# Patient Record
Sex: Female | Born: 1965 | Race: Black or African American | Hispanic: No | Marital: Single | Smoking: Current every day smoker
Health system: Southern US, Community
[De-identification: ages and names within clinical notes are randomized; demographics above are authoritative.]

## PROBLEM LIST (undated history)

## (undated) DIAGNOSIS — K219 Gastro-esophageal reflux disease without esophagitis: Secondary | ICD-10-CM

## (undated) HISTORY — PX: HERNIA REPAIR: SHX51

---

## 2005-07-01 ENCOUNTER — Emergency Department: Payer: Self-pay | Admitting: Unknown Physician Specialty

## 2005-08-20 ENCOUNTER — Emergency Department: Payer: Self-pay | Admitting: General Practice

## 2014-12-29 ENCOUNTER — Ambulatory Visit
Admit: 2014-12-29 | Disposition: A | Discharge status: Home / Self Care | Payer: Self-pay | Attending: Family Medicine | Admitting: Family Medicine

## 2018-03-24 ENCOUNTER — Other Ambulatory Visit: Payer: Self-pay

## 2018-03-24 ENCOUNTER — Encounter: Payer: Self-pay | Admitting: Emergency Medicine

## 2018-03-24 ENCOUNTER — Emergency Department: Payer: BLUE CROSS/BLUE SHIELD

## 2018-03-24 ENCOUNTER — Emergency Department
Admission: EM | Admit: 2018-03-24 | Discharge: 2018-03-24 | Disposition: A | Discharge status: Home / Self Care | Payer: BLUE CROSS/BLUE SHIELD | Attending: Emergency Medicine | Admitting: Emergency Medicine

## 2018-03-24 DIAGNOSIS — T18108A Unspecified foreign body in esophagus causing other injury, initial encounter: Secondary | ICD-10-CM | POA: Diagnosis present

## 2018-03-24 DIAGNOSIS — Y999 Unspecified external cause status: Secondary | ICD-10-CM | POA: Insufficient documentation

## 2018-03-24 DIAGNOSIS — Y9389 Activity, other specified: Secondary | ICD-10-CM | POA: Diagnosis not present

## 2018-03-24 DIAGNOSIS — Y929 Unspecified place or not applicable: Secondary | ICD-10-CM | POA: Diagnosis not present

## 2018-03-24 DIAGNOSIS — W228XXA Striking against or struck by other objects, initial encounter: Secondary | ICD-10-CM | POA: Diagnosis not present

## 2018-03-24 DIAGNOSIS — F17200 Nicotine dependence, unspecified, uncomplicated: Secondary | ICD-10-CM | POA: Insufficient documentation

## 2018-03-24 HISTORY — DX: Gastro-esophageal reflux disease without esophagitis: K21.9

## 2018-03-24 MED ORDER — GI COCKTAIL ~~LOC~~
30.0000 mL | Freq: Once | ORAL | Status: AC
  Administered 2018-03-24: 30 mL via ORAL
  Filled 2018-03-24: qty 30

## 2018-03-24 NOTE — ED Provider Notes (Signed)
CT negative. Pt informed. F/u ENT if sx persist.    Sharman CheekStafford, Jahlia Omura, MD 03/24/18 [phone]

## 2018-03-24 NOTE — Discharge Instructions (Addendum)
Your CT scan of the neck did not show any abnormalities. Please follow up with ENT for further evaluation of your symptoms if they do not resolve in the next 24 hours.

## 2018-03-24 NOTE — ED Provider Notes (Signed)
[facility] Emergency Department Provider Note _____  Time seen: 3:45 AM  I have reviewed the triage vital signs and the nursing notes.   HISTORY  Chief Complaint Foreign Body    HPI Megan Hurley is a 52 y.o. female presents to the emergency department with feeling of a fishbone stuck in her throat after eating fish today.  Patient states pain with swallowing.  Patient able to swallow saliva however states that that is quite painful.  She admits to difficulty eating secondary to discomfort   Past Medical History:  Diagnosis Date  . Acid reflux     There are no active problems to display for this patient.   Past Surgical History:  Procedure Laterality Date  . HERNIA REPAIR      Prior to Admission medications   Not on File    Allergies Clindamycin/lincomycin  No family history on file.  Social History Social History   Tobacco Use  . Smoking status: Current Every Day Smoker  . Smokeless tobacco: Never Used  Substance Use Topics  . Alcohol use: Not on file  . Drug use: Not on file    Review of Systems Constitutional: No fever/chills Eyes: No visual changes. ENT: No sore throat.  Positive for foreign body sensation in the throat. Cardiovascular: Denies chest pain. Respiratory: Denies shortness of breath. Gastrointestinal: No abdominal pain.  No nausea, no vomiting.  No diarrhea.  No constipation. Genitourinary: Negative for dysuria. Musculoskeletal: Negative for neck pain.  Negative for back pain. Integumentary: Negative for rash. Neurological: Negative for headaches, focal weakness or numbness.   ____________________________________________   PHYSICAL EXAM:  VITAL SIGNS: ED Triage Vitals [03/24/18 0108]  Enc Vitals Group     BP (!) 162/79     Pulse Rate 65     Resp 20     Temp 97.7 F (36.5 C)     Temp Source Oral     SpO2 98 %     Weight 88.5 kg (195 lb)     Height 1.626 m (5\' 4" )     Head Circumference     Peak Flow      Pain Score 10     Pain Loc      Pain Edu?      Excl. in GC?     Constitutional: Alert and oriented. Well appearing and in no acute distress. Eyes: Conjunctivae are normal. Head: Atraumatic. Mouth/Throat: Mucous membranes are moist.  Oropharynx non-erythematous. Neck: No stridor.   Cardiovascular: Normal rate, regular rhythm. Good peripheral circulation. Grossly normal heart sounds. Respiratory: Normal respiratory effort.  No retractions. Lungs CTAB. Gastrointestinal: Soft and nontender. No distention.  Musculoskeletal: No lower extremity tenderness nor edema. No gross deformities of extremities. Neurologic:  Normal speech and language. No gross focal neurologic deficits are appreciated.  Skin:  Skin is warm, dry and intact. No rash noted. Psychiatric: Mood and affect are normal. Speech and behavior are normal.  ___________________  RADIOLOGY I, Bonanza N BROWN, personally viewed and evaluated these images (plain radiographs) as part of my medical decision making, as well as reviewing the written report by the radiologist.  ED MD interpretation: No evidence of retained foreign body noted on soft tissue neck per radiologist  Official radiology report(s): Dg Neck Soft Tissue  Result Date: 03/24/2018 CLINICAL DATA:  Possible fish bone stuck in throat. EXAM: NECK SOFT TISSUES - 1+ VIEW COMPARISON:  None. FINDINGS: No radiopaque foreign body to suggest retained fishbone. There is no evidence of  retropharyngeal soft tissue swelling. No soft tissue gas. The cervical airway is unremarkable. Degenerative change in the midcervical spine. IMPRESSION: No evidence of retained foreign body. Electronically Signed   By: Rubye Oaks M.D.   On: 03/24/2018 04:40      Procedures   ____________________________________________   INITIAL IMPRESSION / ASSESSMENT AND PLAN / ED COURSE  As part of my medical decision making, I reviewed the following data within the electronic  MEDICAL RECORD NUMBER   52 year old female presenting with above-stated history and physical exam secondary to concern for possible fishbone in the esophagus.  Soft tissue neck revealed no evidence of retained foreign body.  CT scan of the neck ordered and is pending at this time given patient's continued pain without any relief from GI cocktail.  Patient's care transferred to Dr. Scotty Court ____________________________________________  FINAL CLINICAL IMPRESSION(S) / ED DIAGNOSES  Final diagnoses:  Esophageal foreign body, initial encounter     MEDICATIONS GIVEN DURING THIS VISIT:  Medications  gi cocktail (Maalox,Lidocaine,Donnatal) (30 mLs Oral Given 03/24/18 0452)     ED Discharge Orders    None       Note:  This document was prepared using Dragon voice recognition software and may include unintentional dictation errors.    Darci Current, MD 03/25/18 [phone]

## 2018-03-24 NOTE — ED Notes (Signed)
Pt sleeping in room

## 2018-03-24 NOTE — ED Triage Notes (Addendum)
Patient ambulatory to triage with steady gait, without difficulty or distress noted; pt reports ate fish yesterday for lunch and feels as if it is stuck and "it's got my head hurting"; pt is texting on phone during entire triage

## 2018-03-24 NOTE — ED Notes (Signed)
This nurse went to room to talk to pt and pt was asleep and resting comfortably.

## 2019-12-28 IMAGING — CR DG NECK SOFT TISSUE
2 series · 2 of 2 positions shown · non-contrast
Comparison: None.

CLINICAL DATA: Possible fish bone stuck in throat.

EXAM:
NECK SOFT TISSUES - 1+ VIEW

[neck lat]
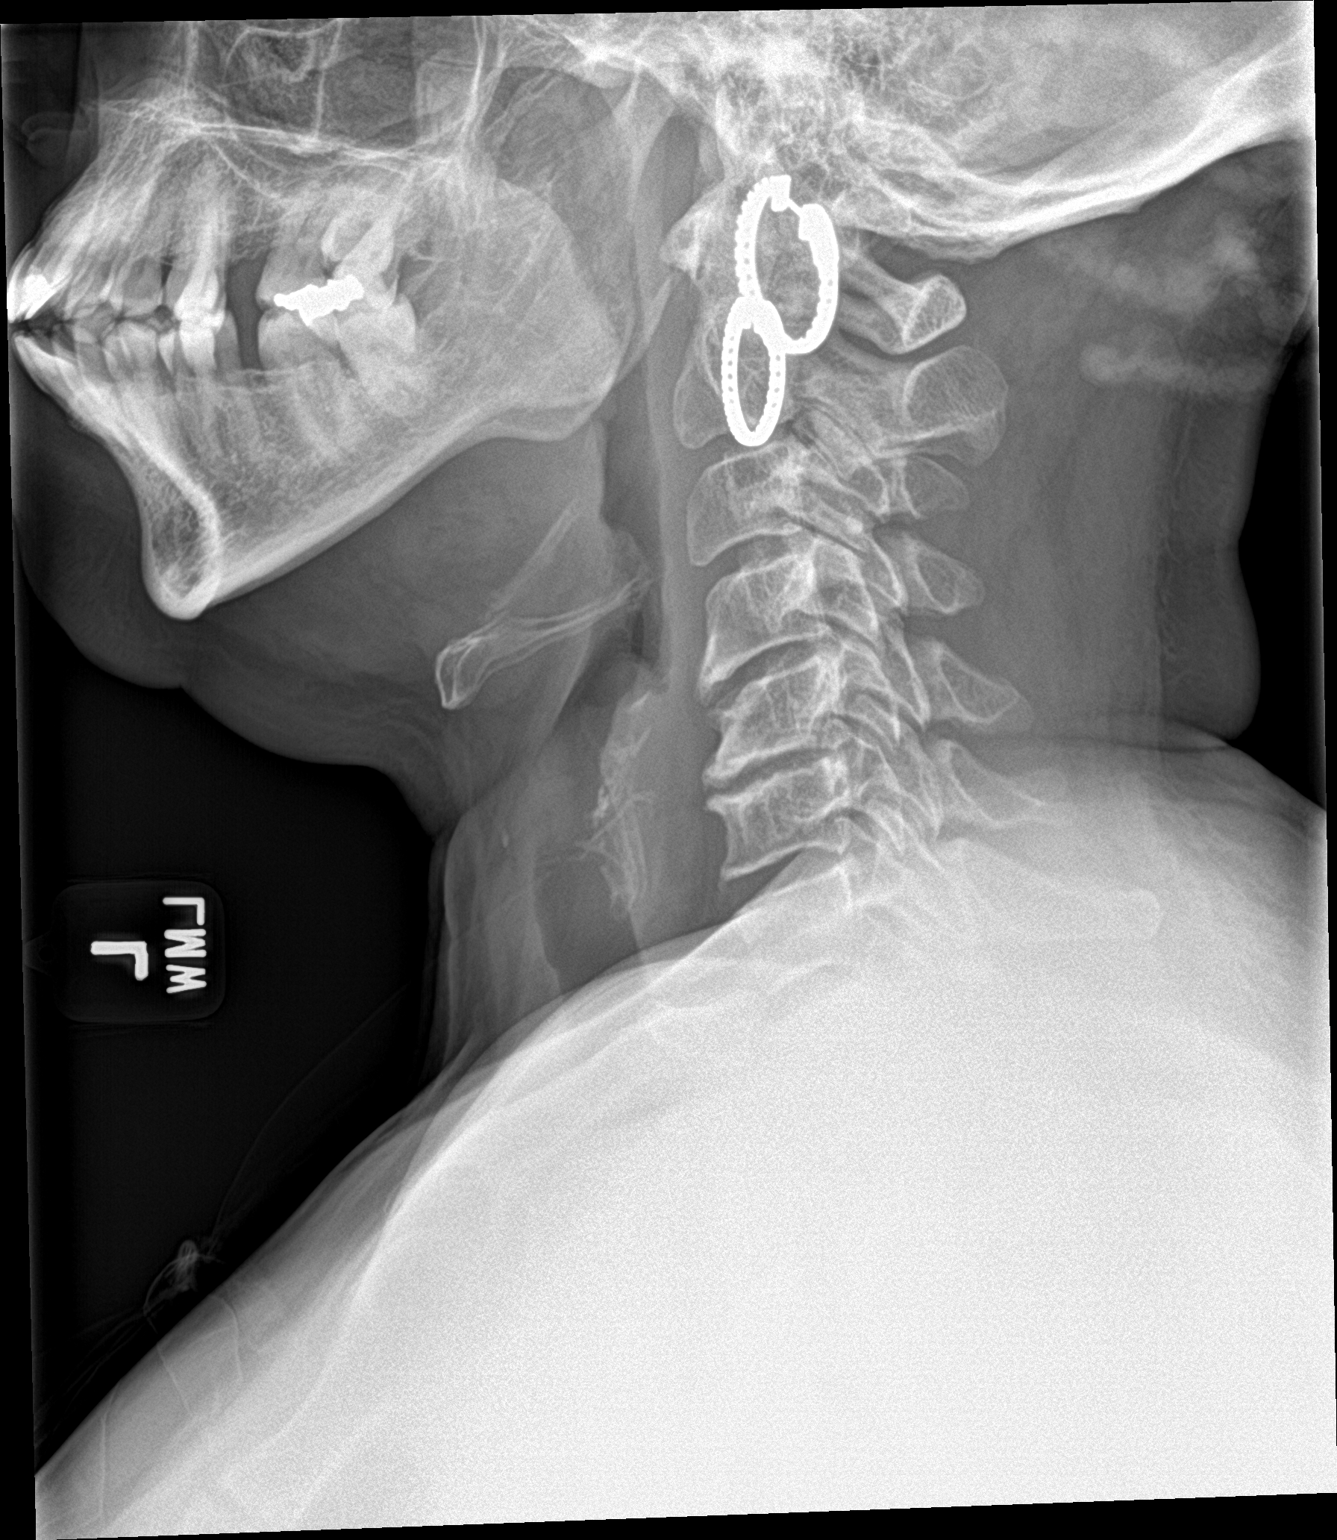

[neck ap]
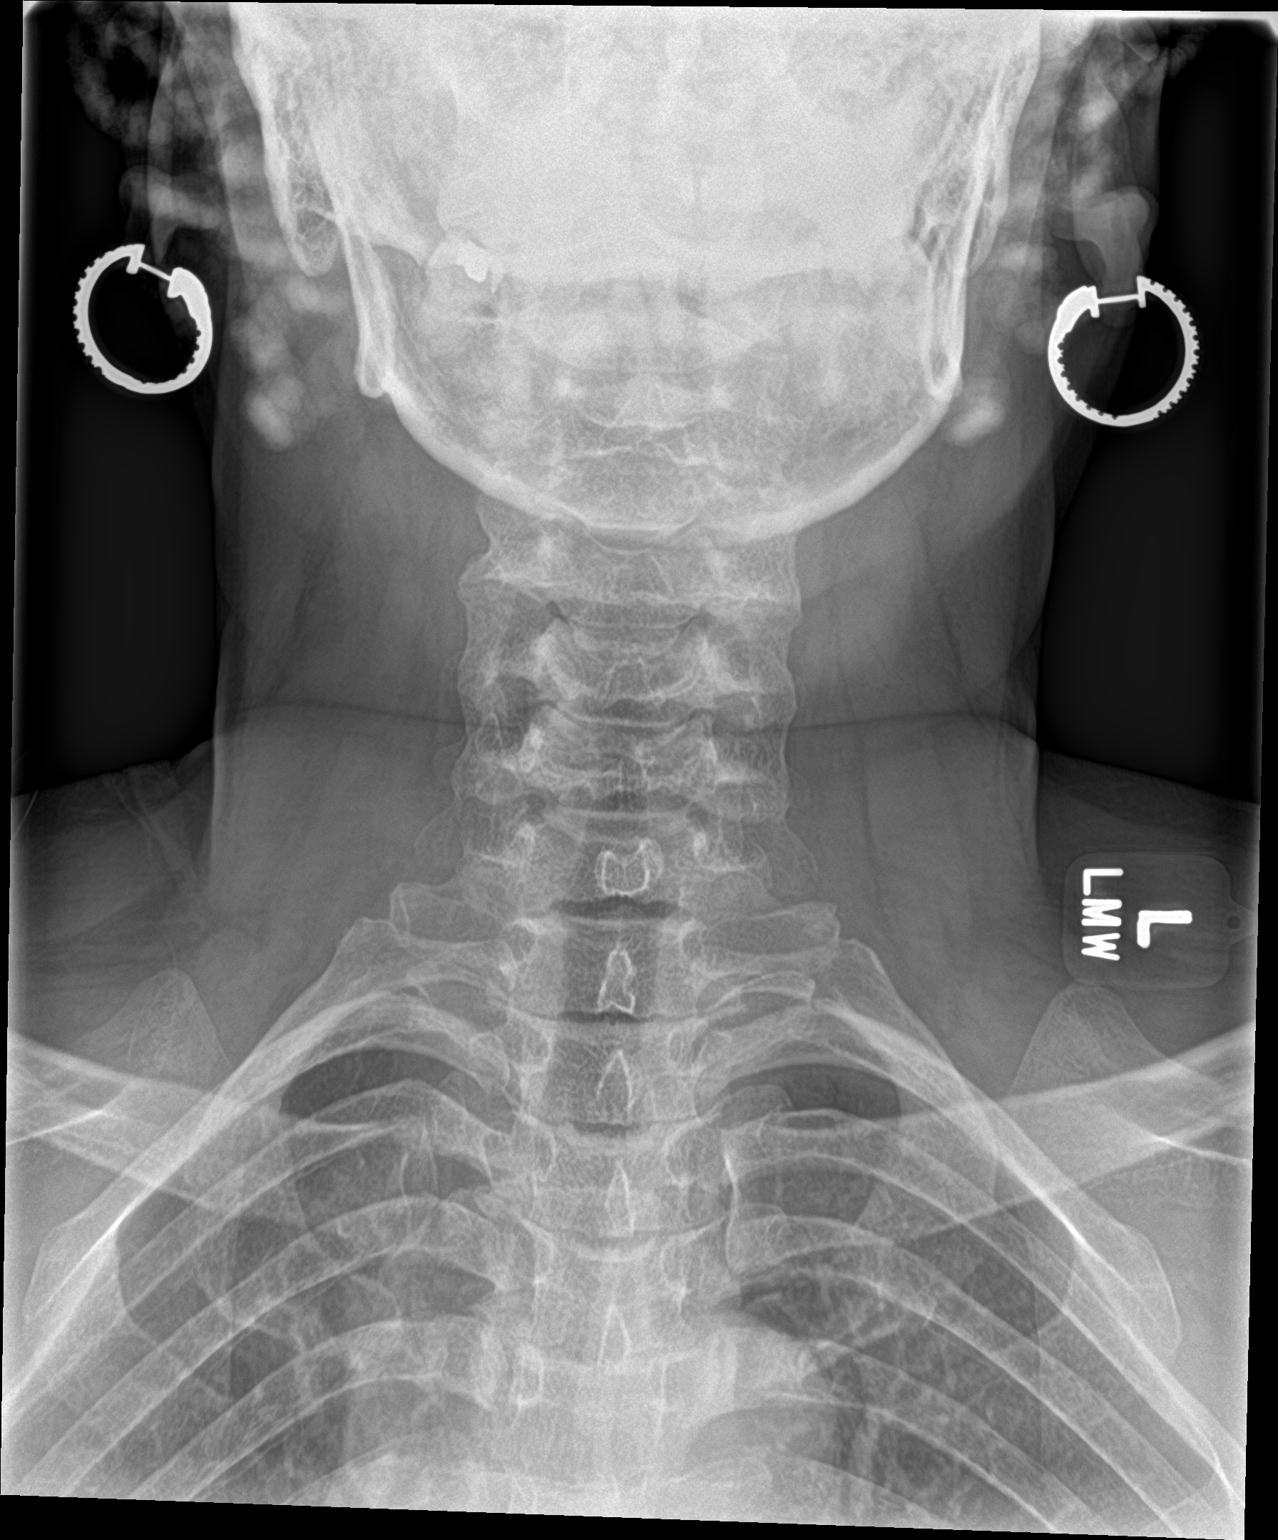

[2 of 2 positions shown; findings below may reference images not displayed]

FINDINGS: No radiopaque foreign body to suggest retained fishbone. There is no
evidence of retropharyngeal soft tissue swelling. No soft tissue
gas. The cervical airway is unremarkable. Degenerative change in the
midcervical spine.
IMPRESSION: No evidence of retained foreign body.

## 2019-12-28 IMAGING — CT CT NECK W/O CM
3 of 5 series · 14 of 33 positions shown, 17 images · non-contrast
Comparison: None.

CLINICAL DATA: Neck pain.  Rule out foreign body, fishbone

EXAM:
CT NECK WITHOUT CONTRAST
TECHNIQUE: Multidetector CT imaging of the neck was performed following the
standard protocol without intravenous contrast.

[Series 6: sag neck · sagittal · 0.51mm/px · 5 of 119 slices shown, 6 images]
[im 40/119  bone]
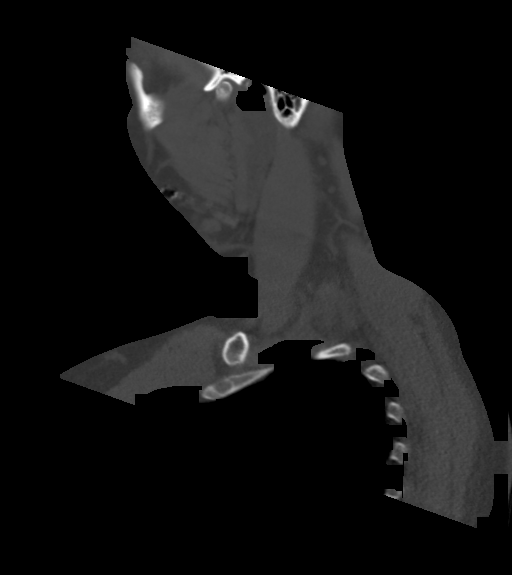
[im 50/119  bone]
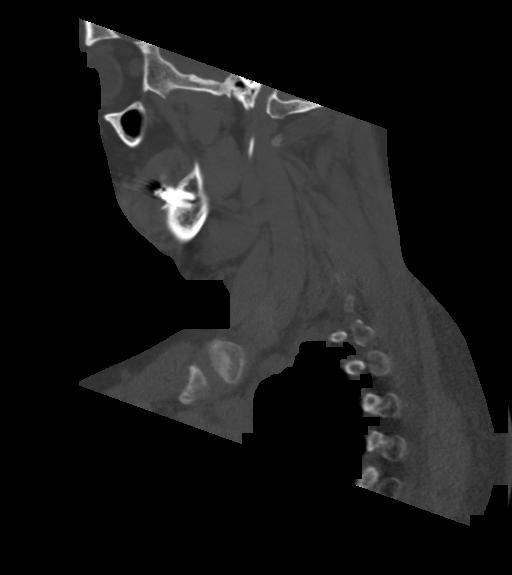
[im 60/119  soft-tissue]
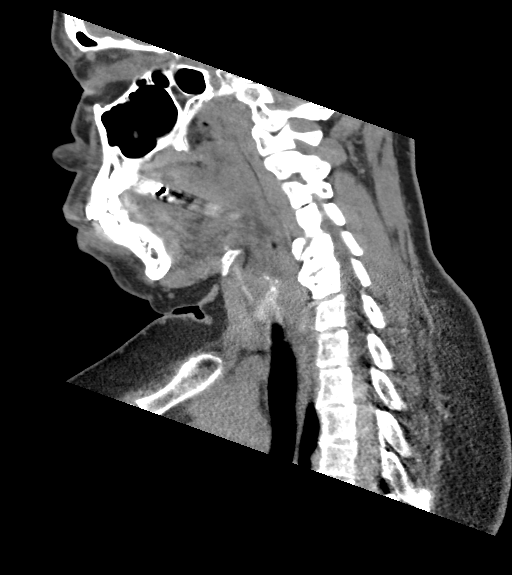
[im 60/119  bone]
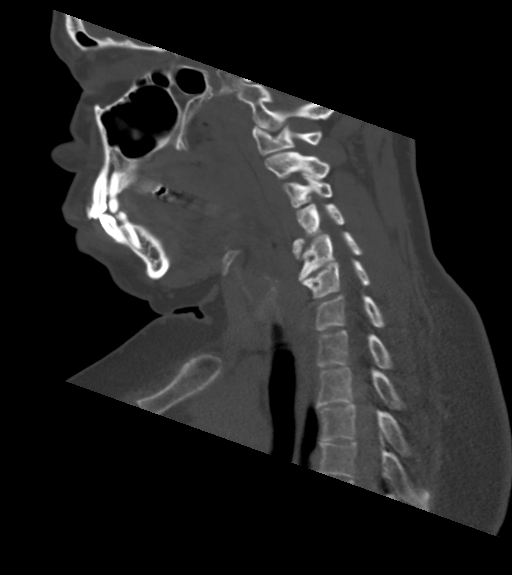
[im 69/119  bone]
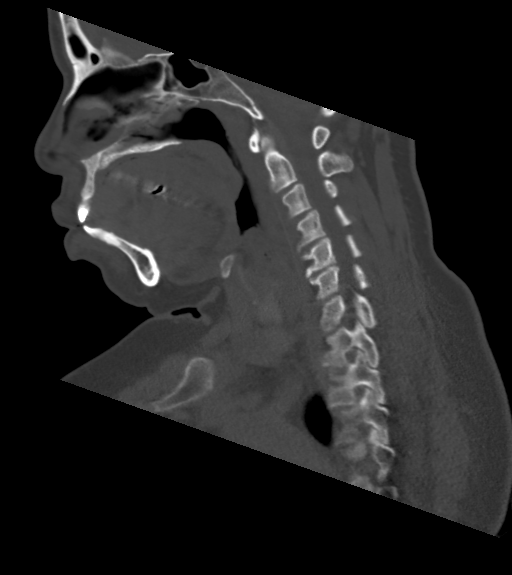
[im 79/119  bone]
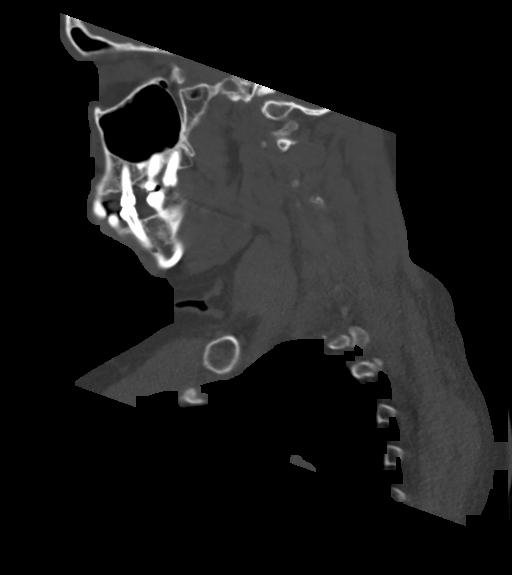

[Series 7: cor neck · coronal · 0.49mm/px · 3 of 125 slices shown]
[im 45/125  bone]
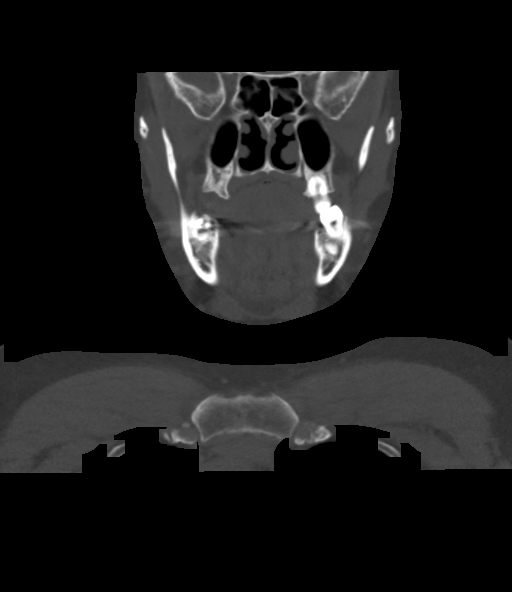
[im 57/125  bone]
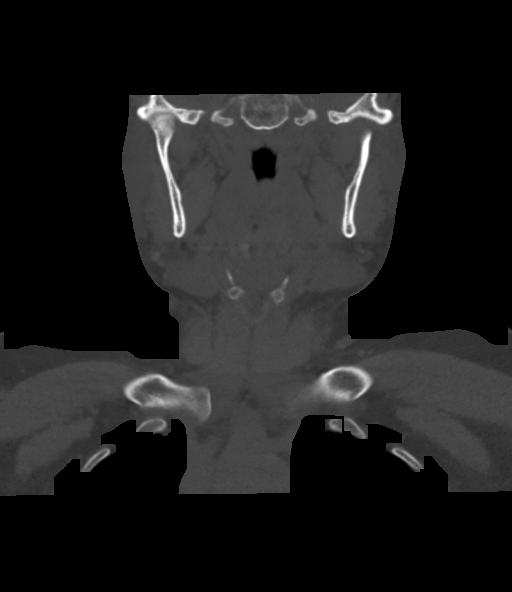
[im 69/125  bone]
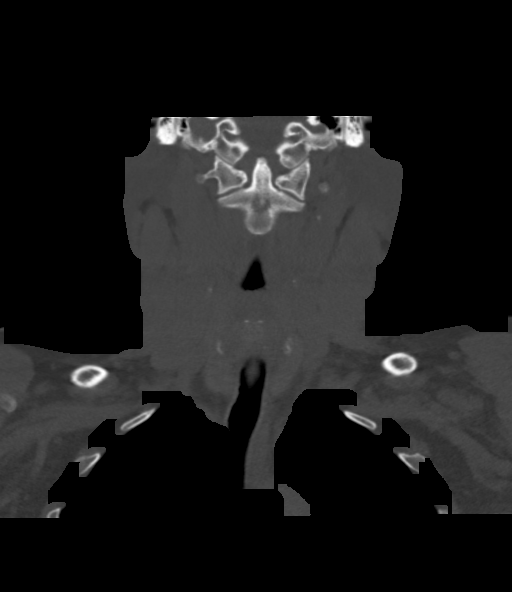

[Series 8: orthogonal ax · axial · 0.46mm/px · z∈[-300,-114]mm · 6 of 152 slices shown, 8 images]
[im 22/152  soft-tissue]
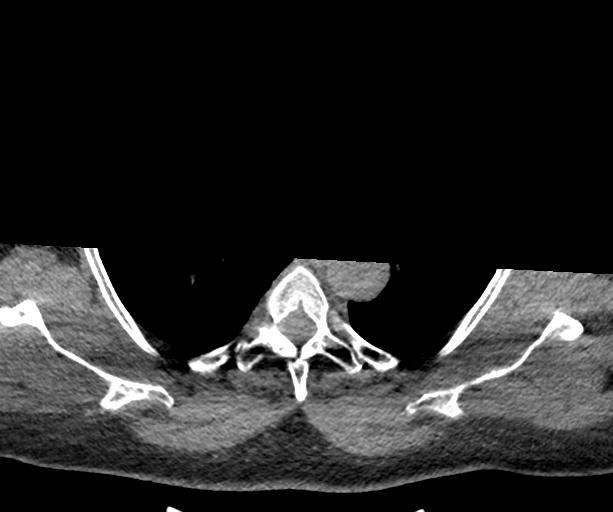
[im 22/152  bone]
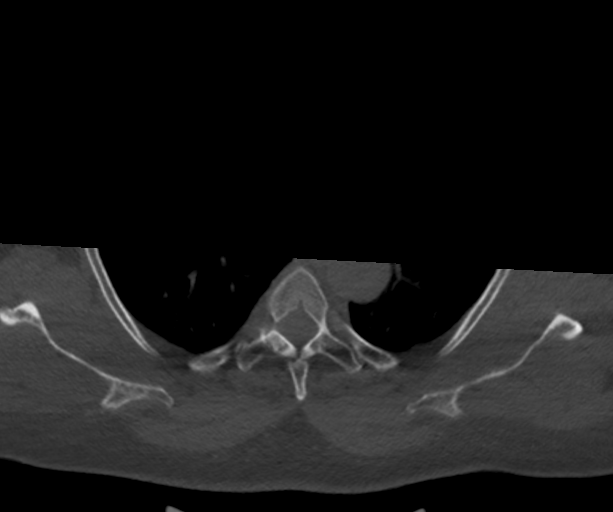
[im 44/152  bone]
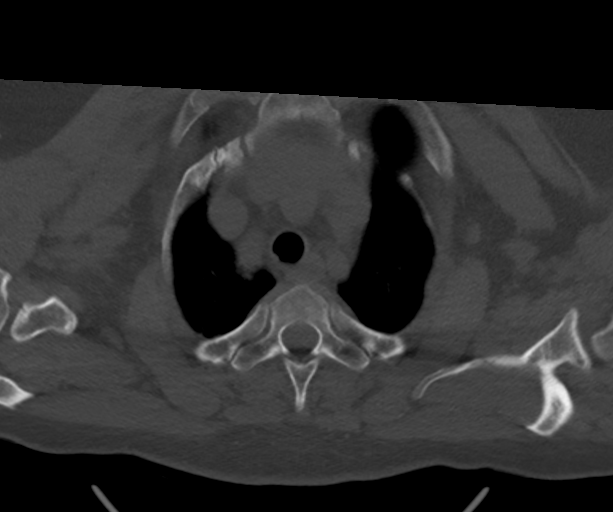
[im 65/152  bone]
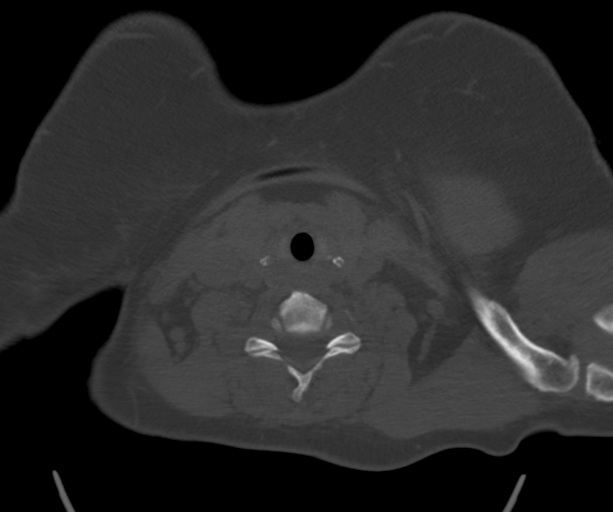
[im 87/152  bone]
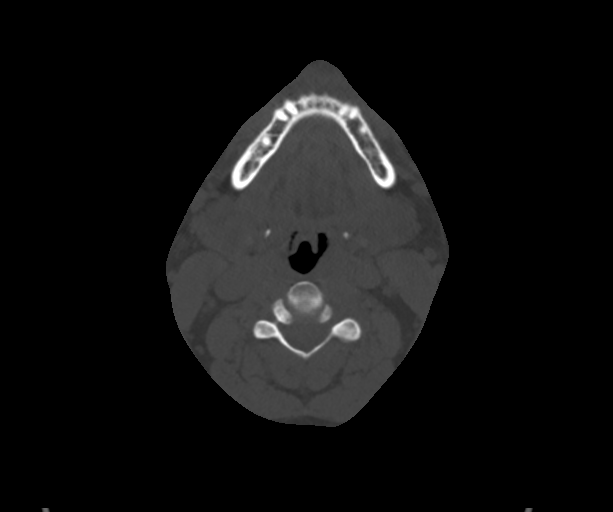
[im 108/152  soft-tissue]
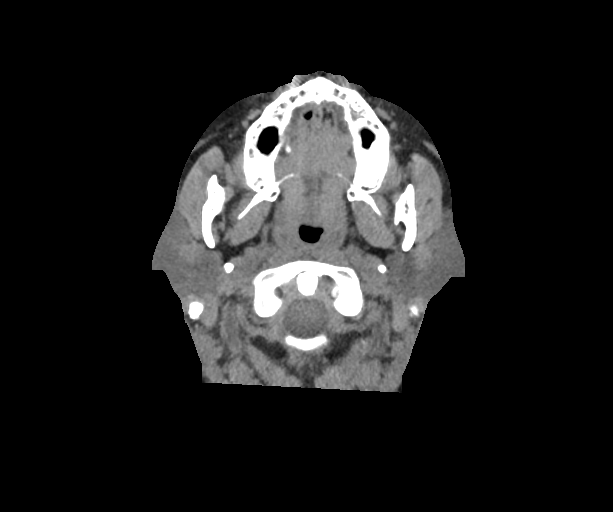
[im 108/152  bone]
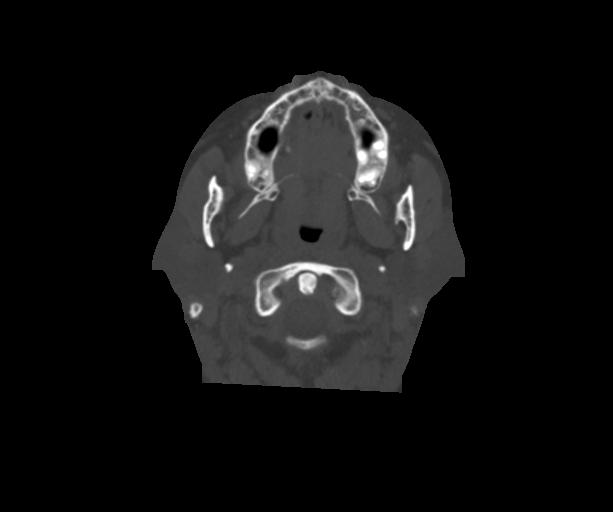
[im 130/152  bone]
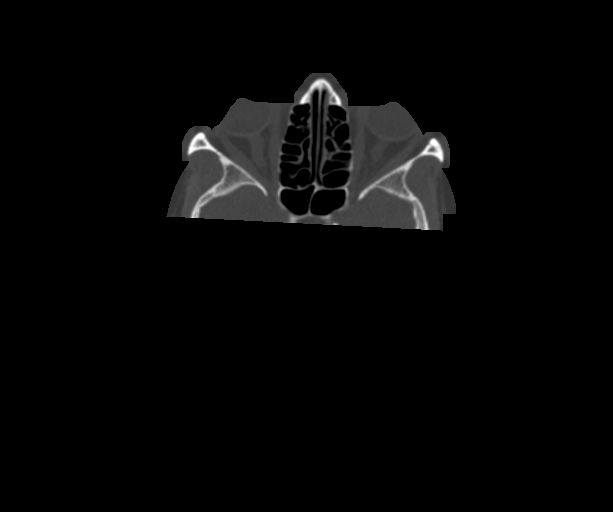

[14 of 33 positions shown; findings below may reference images not displayed]

FINDINGS: Pharynx and larynx: Normal. No mass or swelling.

Negative for foreign body or fish bone in the pharynx or proximal
esophagus

Salivary glands: No inflammation, mass, or stone.

Thyroid: Negative

Lymph nodes: No enlarged lymph nodes in the neck

Vascular: Limited evaluation due to lack of intravenous contrast

Limited intracranial: Negative

Visualized orbits: Negative

Mastoids and visualized paranasal sinuses: Negative

Skeleton: Negative

Upper chest: Negative

Other: None
IMPRESSION: Negative CT neck.  Negative for foreign body.
